# Patient Record
Sex: Female | Born: 1995 | Race: Black or African American | Hispanic: No | Marital: Single | Smoking: Never smoker
Health system: Southern US, Community
[De-identification: ages and names within clinical notes are randomized; demographics above are authoritative.]

## PROBLEM LIST (undated history)

## (undated) DIAGNOSIS — Z1159 Encounter for screening for other viral diseases: Secondary | ICD-10-CM

## (undated) DIAGNOSIS — F321 Major depressive disorder, single episode, moderate: Principal | ICD-10-CM

## (undated) HISTORY — PX: TONSILLECTOMY: SUR1361

---
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
## 2016-01-20 ENCOUNTER — Encounter (HOSPITAL_COMMUNITY): Payer: Self-pay | Admitting: Emergency Medicine

## 2016-01-20 ENCOUNTER — Emergency Department (HOSPITAL_COMMUNITY)
Admission: EM | Admit: 2016-01-20 | Discharge: 2016-01-20 | Disposition: A | Discharge status: Home / Self Care | Payer: BLUE CROSS/BLUE SHIELD | Attending: Emergency Medicine | Admitting: Emergency Medicine

## 2016-01-20 DIAGNOSIS — Z79899 Other long term (current) drug therapy: Secondary | ICD-10-CM | POA: Insufficient documentation

## 2016-01-20 DIAGNOSIS — Y999 Unspecified external cause status: Secondary | ICD-10-CM | POA: Diagnosis not present

## 2016-01-20 DIAGNOSIS — X58XXXA Exposure to other specified factors, initial encounter: Secondary | ICD-10-CM | POA: Insufficient documentation

## 2016-01-20 DIAGNOSIS — Y929 Unspecified place or not applicable: Secondary | ICD-10-CM | POA: Insufficient documentation

## 2016-01-20 DIAGNOSIS — S30814A Abrasion of vagina and vulva, initial encounter: Secondary | ICD-10-CM | POA: Insufficient documentation

## 2016-01-20 DIAGNOSIS — Y939 Activity, unspecified: Secondary | ICD-10-CM | POA: Insufficient documentation

## 2016-01-20 DIAGNOSIS — N898 Other specified noninflammatory disorders of vagina: Secondary | ICD-10-CM | POA: Diagnosis present

## 2016-01-20 LAB — WET PREP, GENITAL
Clue Cells Wet Prep HPF POC: NONE SEEN
SPERM: NONE SEEN
Trich, Wet Prep: NONE SEEN
Yeast Wet Prep HPF POC: NONE SEEN

## 2016-01-20 LAB — POC URINE PREG, ED: PREG TEST UR: NEGATIVE

## 2016-01-20 MED ORDER — IBUPROFEN 800 MG PO TABS
800.0000 mg | ORAL_TABLET | Freq: Once | ORAL | Status: AC
  Administered 2016-01-20: 800 mg via ORAL
  Filled 2016-01-20: qty 1

## 2016-01-20 NOTE — Discharge Instructions (Signed)
1. Medications: usual home medications 2. Treatment: rest, drink plenty of fluids, no sexual intercourse for 5-7 days 3. Follow Up: Please followup with your OB/GYN at your upcoming appointment for discussion of your diagnoses and further evaluation after today's visit; if you do not have a primary care doctor use the resource guide provided to find one; Please return to the ER for worsening symptoms

## 2016-01-20 NOTE — ED Provider Notes (Signed)
CSN: [id number]     Arrival date & time 01/20/16  0131 History   First MD Initiated Contact with Patient 01/20/16 [phone]     Chief Complaint  Patient presents with  . Vaginal Discharge  . Vaginal Pain     (Consider location/radiation/quality/duration/timing/severity/associated sxs/prior Treatment) Patient is a 20 y.o. female presenting with vaginal pain. The history is provided by the patient and medical records. No language interpreter was used.  Vaginal Pain Pertinent negatives include no abdominal pain, chest pain, coughing, diaphoresis, fatigue, fever, headaches, nausea, rash or vomiting.     Audrey Ryan is a 20 y.o. female  with No major medical problems presents to the Emergency Department complaining of gradual, persistent, progressively worsening vaginal pain onset this morning.  Reports the pain as a soreness and rated at a 6 out of 10. Patient reports she sexually active with 1 female partner. She reports this the same partner for the last 12 months. She reports experimenting with a new sexual position last night and this morning had vaginal pain. She reports that she attempted to have sexual intercourse this afternoon when the pain increased. She reports that she has some vaginal discharge but this is normal for her and it is clear nonodorous. She denies urinary symptoms. She denies fever, chills, abdominal pain, nausea, vomiting, diarrhea. Patient reports she has nexplanon, but intermittently has unprotected sex. No treatments prior to arrival. No alleviating factors.   History reviewed. No pertinent past medical history. History reviewed. No pertinent past surgical history. No family history on file. Social History  Substance Use Topics  . Smoking status: Never Smoker   . Smokeless tobacco: None  . Alcohol Use: 1.8 oz/week    3 Shots of liquor per week   OB History    No data available     Review of Systems  Constitutional: Negative for fever, diaphoresis, appetite  change, fatigue and unexpected weight change.  HENT: Negative for mouth sores.   Eyes: Negative for visual disturbance.  Respiratory: Negative for cough, chest tightness, shortness of breath and wheezing.   Cardiovascular: Negative for chest pain.  Gastrointestinal: Negative for nausea, vomiting, abdominal pain, diarrhea and constipation.  Endocrine: Negative for polydipsia, polyphagia and polyuria.  Genitourinary: Positive for vaginal discharge and vaginal pain. Negative for dysuria, urgency, frequency and hematuria.  Musculoskeletal: Negative for back pain and neck stiffness.  Skin: Negative for rash.  Allergic/Immunologic: Negative for immunocompromised state.  Neurological: Negative for syncope, light-headedness and headaches.  Hematological: Does not bruise/bleed easily.  Psychiatric/Behavioral: Negative for sleep disturbance. The patient is not nervous/anxious.       Allergies  Other  Home Medications   Prior to Admission medications   Medication Sig Start Date End Date Taking? Authorizing Provider  acetaminophen (TYLENOL) 500 MG tablet Take 1,000 mg by mouth every 6 (six) hours as needed (for pain.).   Yes Historical Provider, MD  etonogestrel (NEXPLANON) 68 MG IMPL implant 1 each by Subdermal route once.   Yes Historical Provider, MD  ibuprofen (ADVIL,MOTRIN) 200 MG tablet Take 400 mg by mouth every 6 (six) hours as needed (for pain.).   Yes Historical Provider, MD   BP 104/89 mmHg  Pulse 71  Temp(Src) 98.4 F (36.9 C) (Oral)  Resp 17  Ht  (1.549 m)  Wt 58.968 kg  BMI 24.58 kg/m2  SpO2 100% Physical Exam  Constitutional: She appears well-developed and well-nourished. No distress.  HENT:  Head: Normocephalic and atraumatic.  Eyes: Conjunctivae are normal.  Neck: Normal  range of motion.  Cardiovascular: Normal rate, regular rhythm, normal heart sounds and intact distal pulses.   No murmur heard. Pulmonary/Chest: Effort normal and breath sounds normal. No  respiratory distress. She has no wheezes.  Abdominal: Soft. Bowel sounds are normal. There is no tenderness. There is no rebound and no guarding. Hernia confirmed negative in the right inguinal area and confirmed negative in the left inguinal area.  Genitourinary: Uterus normal. No labial fusion. There is no rash, tenderness or lesion on the right labia. There is no rash, tenderness or lesion on the left labia. Uterus is not deviated, not enlarged, not fixed and not tender. Cervix exhibits no motion tenderness, no discharge and no friability. Right adnexum displays no mass, no tenderness and no fullness. Left adnexum displays no mass, no tenderness and no fullness. No erythema, tenderness or bleeding in the vagina. No foreign body around the vagina. No signs of injury around the vagina. Vaginal discharge (minimal, white, nonodorous ) found.  Several small abrasions to the walls of the vaginal vault; no lacerations or tears noted. No bleeding  Musculoskeletal: Normal range of motion. She exhibits no edema.  Lymphadenopathy:       Right: No inguinal adenopathy present.       Left: No inguinal adenopathy present.  Neurological: She is alert.  Skin: Skin is warm and dry. She is not diaphoretic. No erythema.  Psychiatric: She has a normal mood and affect.  Nursing note and vitals reviewed.   ED Course  Procedures (including critical care time) Labs Review Labs Reviewed  WET PREP, GENITAL - Abnormal; Notable for the following:    WBC, Wet Prep HPF POC MANY (*)    All other components within normal limits  POC URINE PREG, ED  GC/CHLAMYDIA PROBE AMP ([place]) NOT AT [facility]    MDM   Final diagnoses:  Vaginal abrasion, initial encounter   [name] presents with vaginal pain after intercourse.  Small abrasions noted; no lacerations. No signs of infection or CMT.  Neg pregnancy test. Recommend abstinence until pain is resolved.  Ibuprofen for pain.    Dahlia ClientHannah Manilla Strieter,  PA-C 01/20/16 [phone]  Lyndal Pulleyaniel Knott, MD 01/20/16 [phone]

## 2016-01-20 NOTE — ED Notes (Signed)
Pt states that after having intercourse last night she began feeling pain in her vagina. Pt describes this as a "soreness" that she rates at 6/10. Pt states she currently has vaginal discharge, but describes it as "normal and clear". Pt denies urinary symptoms. Pt states she has had unprotected sex

## 2016-01-20 NOTE — ED Notes (Signed)
Pt reports understanding of discharge information. No questions at time of discharge 

## 2016-01-20 NOTE — ED Notes (Signed)
Pt reports soreness in vagina that started yesterday morning after intercourse. PT denies any discharge or urinary symptoms.

## 2016-01-21 LAB — GC/CHLAMYDIA PROBE AMP (~~LOC~~) NOT AT ARMC
Chlamydia: NEGATIVE
NEISSERIA GONORRHEA: NEGATIVE

## 2016-11-25 ENCOUNTER — Emergency Department (HOSPITAL_COMMUNITY): Payer: BLUE CROSS/BLUE SHIELD

## 2016-11-25 ENCOUNTER — Emergency Department (HOSPITAL_COMMUNITY)
Admission: EM | Admit: 2016-11-25 | Discharge: 2016-11-25 | Disposition: A | Discharge status: Home / Self Care | Payer: BLUE CROSS/BLUE SHIELD | Attending: Emergency Medicine | Admitting: Emergency Medicine

## 2016-11-25 ENCOUNTER — Encounter (HOSPITAL_COMMUNITY): Payer: Self-pay

## 2016-11-25 DIAGNOSIS — M654 Radial styloid tenosynovitis [de Quervain]: Secondary | ICD-10-CM | POA: Insufficient documentation

## 2016-11-25 DIAGNOSIS — Z79899 Other long term (current) drug therapy: Secondary | ICD-10-CM | POA: Insufficient documentation

## 2016-11-25 DIAGNOSIS — M25531 Pain in right wrist: Secondary | ICD-10-CM | POA: Diagnosis present

## 2016-11-25 MED ORDER — MELOXICAM 15 MG PO TABS: 15.0000 mg | ORAL_TABLET | Freq: Every day | ORAL | 0 refills | Status: DC

## 2016-11-25 NOTE — Discharge Instructions (Signed)
General instructions  Return to your normal activities as told by your health care provider. Ask your health care provider what activities are safe for you. Take over-the-counter and prescription medicines only as told by your health care provider. Keep all follow-up visits as told by your health care provider. This is important. Do not drive or operate heavy machinery while taking prescription pain medicine. Contact a health care provider if: Your pain, tenderness, or swelling gets worse, even if you have had treatment. You have numbness or tingling in your wrist, hand, or fingers on the injured side.

## 2016-11-25 NOTE — ED Notes (Signed)
Pt well appearing, alert and oriented. Ambulates off unit accompanied by friends

## 2016-11-25 NOTE — ED Provider Notes (Signed)
[facility] Provider Note   CSN: [id number] Arrival date & time: 11/25/16  1649     History   Chief Complaint Chief Complaint  Patient presents with  . Wrist Pain    HPI Audrey Ryan is a 21 y.o. female. She is a Archivistcollege student. She presents emergency Department with chief complaint of 2 weeks of worsening pain in her right wrist and thumb. She has no inciting injuries. She does type a lot. She's never had a pain like this before. Pain is worse when she moves her some especially with extension. She denies any fevers or chills.   Wrist Pain     History reviewed. No pertinent past medical history.  There are no active problems to display for this patient.   Past Surgical History:  Procedure Laterality Date  . TONSILLECTOMY      OB History    No data available       Home Medications    Prior to Admission medications   Medication Sig Start Date End Date Taking? Authorizing Provider  acetaminophen (TYLENOL) 500 MG tablet Take 1,000 mg by mouth every 6 (six) hours as needed (for pain.).    [provider]  etonogestrel (NEXPLANON) 68 MG IMPL implant 1 each by Subdermal route once.    [provider]  ibuprofen (ADVIL,MOTRIN) 200 MG tablet Take 400 mg by mouth every 6 (six) hours as needed (for pain.).    [provider]  meloxicam (MOBIC) 15 MG tablet Take 1 tablet (15 mg total) by mouth daily. Take 1 daily with food. 11/25/16   Arthor CaptainHarris, Macky Galik, PA-C    Family History No family history on file.  Social History Social History  Substance Use Topics  . Smoking status: Never Smoker  . Smokeless tobacco: Never Used  . Alcohol use 1.8 oz/week    3 Shots of liquor per week     Allergies   Other   Review of Systems Review of Systems  Constitutional: Negative for chills and fever.  Musculoskeletal: Positive for myalgias.     Physical Exam Updated Vital Signs BP 122/77 (BP Location: Left Arm)   Pulse 92   Temp 98.6  F (37 C) (Oral)   Resp 17   SpO2 100%   Physical Exam  Constitutional: She is oriented to person, place, and time. She appears well-developed and well-nourished. No distress.  HENT:  Head: Normocephalic and atraumatic.  Eyes: Conjunctivae are normal. No scleral icterus.  Neck: Normal range of motion.  Cardiovascular: Normal rate, regular rhythm and normal heart sounds.  Exam reveals no gallop and no friction rub.   No murmur heard. Pulmonary/Chest: Effort normal and breath sounds normal. No respiratory distress.  Abdominal: Soft. Bowel sounds are normal. She exhibits no distension and no mass. There is no tenderness. There is no guarding.  Musculoskeletal:  Exquisitely tender to palpation along the extensor tendons of the right thumb. No heat, swelling or crepitus.  Neurological: She is alert and oriented to person, place, and time.  Skin: Skin is warm and dry. She is not diaphoretic.  Psychiatric: Her behavior is normal.  Nursing note and vitals reviewed.    ED Treatments / Results  Labs (all labs ordered are listed, but only abnormal results are displayed) Labs Reviewed - No data to display  EKG  EKG Interpretation None       Radiology Dg Wrist Complete Right  Result Date: 11/25/2016 CLINICAL DATA:  Pain to the lateral wrist area EXAM: RIGHT WRIST -  COMPLETE 3+ VIEW COMPARISON:  None. FINDINGS: There is no evidence of fracture or dislocation. There is no evidence of arthropathy or other focal bone abnormality. Soft tissues are unremarkable. IMPRESSION: Negative. Electronically Signed   By: Jasmine Pang M.D.   On: 11/25/2016 17:19    Procedures Procedures (including critical care time)  Medications Ordered in ED Medications - No data to display   Initial Impression / Assessment and Plan / ED Course  I have reviewed the triage vital signs and the nursing notes.  Pertinent labs & imaging results that were available during my care of the patient were reviewed by me  and considered in my medical decision making (see chart for details).  Clinical Course as of Nov 25 1849  Tue Nov 25, 2016  1809 DG Wrist Complete Right [AH]    Clinical Course User Index [AH] Arthor Captain, PA-C     Patient's x-ray negative for any obvious fracture, dislocation or other acute ligamentous injury. Her physical examination is consistent with de Quervain's tenosynovitis. I doubt any infectious etiology. Patient will be treated with conservative therapy with a removable thumb spica, ice, anti-inflammatories and rest. She's been given follow-up with hand specialist.  Final Clinical Impressions(s) / ED Diagnoses   Final diagnoses:  Tenosynovitis, de Quervain    New Prescriptions Discharge Medication List as of 11/25/2016  6:42 PM    START taking these medications   Details  meloxicam (MOBIC) 15 MG tablet Take 1 tablet (15 mg total) by mouth daily. Take 1 daily with food., Starting Tue 11/25/2016, Print         Tiburcio Pea [place], PA-C 11/25/16 1851    Blane Ohara, MD 11/26/16 [id number]

## 2016-11-25 NOTE — ED Triage Notes (Signed)
Pt reports right wrist pain for 2 weeks, she is not sure how she injured it. Pt reports she looked up her symptoms on google and thinks it is a bone spur

## 2016-11-25 NOTE — Progress Notes (Signed)
Orthopedic Tech Progress Note Patient Details:  Leanna SatoMichaela Ey Mar 29, 1996 [id number]  Ortho Devices Type of Ortho Device: Thumb velcro splint Ortho Device/Splint Location: applied thumb velcro splint to pt right hand. pt tolerated well.  Family at bedside.  Right Hand.  Ortho Device/Splint Interventions: Application, Adjustment   Alvina ChouWilliams, Jeremiah Tarpley C 11/25/2016, 7:00 PM

## 2017-05-12 ENCOUNTER — Ambulatory Visit (HOSPITAL_COMMUNITY)
Admission: EM | Admit: 2017-05-12 | Discharge: 2017-05-12 | Disposition: A | Discharge status: Home / Self Care | Payer: BLUE CROSS/BLUE SHIELD | Attending: Internal Medicine | Admitting: Internal Medicine

## 2017-05-12 ENCOUNTER — Encounter (HOSPITAL_COMMUNITY): Payer: Self-pay | Admitting: Emergency Medicine

## 2017-05-12 DIAGNOSIS — B349 Viral infection, unspecified: Secondary | ICD-10-CM | POA: Diagnosis not present

## 2017-05-12 MED ORDER — CETIRIZINE-PSEUDOEPHEDRINE ER 5-120 MG PO TB12: 1.0000 | ORAL_TABLET | Freq: Every day | ORAL | 0 refills | Status: DC

## 2017-05-12 MED ORDER — BENZONATATE 100 MG PO CAPS: 100.0000 mg | ORAL_CAPSULE | Freq: Three times a day (TID) | ORAL | 0 refills | Status: DC

## 2017-05-12 MED ORDER — FLUTICASONE PROPIONATE 50 MCG/ACT NA SUSP: 2.0000 | Freq: Every day | NASAL | 0 refills | Status: DC

## 2017-05-12 NOTE — ED Provider Notes (Signed)
[facility]    CSN: [id number] Arrival date & time: 05/12/17  1231     History   Chief Complaint Chief Complaint  Patient presents with  . Influenza    HPI Leanna SatoMichaela Stonebraker is a 21 y.o. female.   21 year old female comes in for 3-4 day history of URI symptoms. She has had sore throat, congestion, rhinorrhea, productive cough, body aches. Subjective fever. Also has some bilateral otalgia. Has been taking dayquil/nyquil without improvement. Positive sick contact, stating her roommate was tested positive for flu. Never smoker.        History reviewed. No pertinent past medical history.  There are no active problems to display for this patient.   Past Surgical History:  Procedure Laterality Date  . TONSILLECTOMY      OB History    No data available       Home Medications    Prior to Admission medications   Medication Sig Start Date End Date Taking? Authorizing Provider  etonogestrel (NEXPLANON) 68 MG IMPL implant 1 each by Subdermal route once.   Yes [provider]  benzonatate (TESSALON) 100 MG capsule Take 1 capsule (100 mg total) every 8 (eight) hours by mouth. 05/12/17   Cathie HoopsYu, Amy V, PA-C  cetirizine-pseudoephedrine (ZYRTEC-D) 5-120 MG tablet Take 1 tablet daily by mouth. 05/12/17   Cathie HoopsYu, Amy V, PA-C  fluticasone (FLONASE) 50 MCG/ACT nasal spray Place 2 sprays daily into both nostrils. 05/12/17   Belinda FisherYu, Amy V, PA-C    Family History No family history on file.  Social History Social History   Tobacco Use  . Smoking status: Never Smoker  . Smokeless tobacco: Never Used  Substance Use Topics  . Alcohol use: Yes    Alcohol/week: 1.8 oz    Types: 3 Shots of liquor per week  . Drug use: Not on file     Allergies   Other   Review of Systems Review of Systems  Reason unable to perform ROS: See HPI as above.     Physical Exam Triage Vital Signs ED Triage Vitals  Enc Vitals Group     BP 05/12/17 1319 (!) 141/90     Pulse Rate  05/12/17 1319 (!) 59     Resp 05/12/17 1319 16     Temp 05/12/17 1319 99.2 F (37.3 C)     Temp Source 05/12/17 1319 Temporal     SpO2 05/12/17 1319 100 %     Weight 05/12/17 1319 135 lb (61.2 kg)     Height 05/12/17 1319 5\' 1"  (1.549 m)     Head Circumference --      Peak Flow --      Pain Score 05/12/17 1320 7     Pain Loc --      Pain Edu? --      Excl. in GC? --    No data found.  Updated Vital Signs BP (!) 141/90   Pulse (!) 59   Temp 99.2 F (37.3 C) (Temporal)   Resp 16   Ht 5\' 1"  (1.549 m)   Wt 135 lb (61.2 kg)   SpO2 100%   BMI 25.51 kg/m   Physical Exam  Constitutional: She is oriented to person, place, and time. She appears well-developed and well-nourished. No distress.  HENT:  Head: Normocephalic and atraumatic.  Right Ear: External ear and ear canal normal. Tympanic membrane is erythematous. Tympanic membrane is not bulging.  Left Ear: External ear and ear canal normal. Tympanic membrane is  erythematous. Tympanic membrane is not bulging.  Nose: Mucosal edema and rhinorrhea present. Right sinus exhibits maxillary sinus tenderness and frontal sinus tenderness. Left sinus exhibits maxillary sinus tenderness and frontal sinus tenderness.  Mouth/Throat: Uvula is midline, oropharynx is clear and moist and mucous membranes are normal.  Eyes: Conjunctivae are normal. Pupils are equal, round, and reactive to light.  Neck: Normal range of motion. Neck supple.  Cardiovascular: Normal rate, regular rhythm and normal heart sounds. Exam reveals no gallop and no friction rub.  No murmur heard. Pulmonary/Chest: Effort normal and breath sounds normal. She has no decreased breath sounds. She has no wheezes. She has no rhonchi. She has no rales.  Lymphadenopathy:    She has no cervical adenopathy.  Neurological: She is alert and oriented to person, place, and time.  Skin: Skin is warm and dry.  Psychiatric: She has a normal mood and affect. Her behavior is normal. Judgment  normal.     UC Treatments / Results  Labs (all labs ordered are listed, but only abnormal results are displayed) Labs Reviewed - No data to display  EKG  EKG Interpretation None       Radiology No results found.  Procedures Procedures (including critical care time)  Medications Ordered in UC Medications - No data to display   Initial Impression / Assessment and Plan / UC Course  I have reviewed the triage vital signs and the nursing notes.  Pertinent labs & imaging results that were available during my care of the patient were reviewed by me and considered in my medical decision making (see chart for details).    Discussed with patient history and exam most consistent with viral URI. Symptomatic treatment as needed. Push fluids. Return precautions given.   Final Clinical Impressions(s) / UC Diagnoses   Final diagnoses:  Viral illness    ED Discharge Orders        Ordered    benzonatate (TESSALON) 100 MG capsule  Every 8 hours     05/12/17 1440    fluticasone (FLONASE) 50 MCG/ACT nasal spray  Daily     05/12/17 1440    cetirizine-pseudoephedrine (ZYRTEC-D) 5-120 MG tablet  Daily     05/12/17 1440        Belinda FisherYu, Amy V, [place] 05/12/17 1443

## 2017-05-12 NOTE — ED Triage Notes (Signed)
PT reports flu symptoms for 3-4 days. PT reports her roommate tested positive for the flu

## 2017-05-12 NOTE — Discharge Instructions (Signed)
Tessalon for cough. Start flonase, zyrtec-D for nasal congestion. You can use over the counter nasal saline rinse such as neti pot for nasal congestion. Keep hydrated, your urine should be clear to pale yellow in color. Tylenol/motrin for fever and pain. Monitor for any worsening of symptoms, chest pain, shortness of breath, wheezing, swelling of the throat, follow up for reevaluation.  ° °For sore throat try using a honey-based tea. Use 3 teaspoons of honey with juice squeezed from half lemon. Place shaved pieces of ginger into 1/2-1 cup of water and warm over stove top. Then mix the ingredients and repeat every 4 hours as needed. ° °

## 2018-02-07 IMAGING — DX DG WRIST COMPLETE 3+V*R*
4 series · 4 of 4 positions shown · non-contrast
Comparison: None.

CLINICAL DATA: Pain to the lateral wrist area

EXAM:
RIGHT WRIST - COMPLETE 3+ VIEW

[wrist pa]
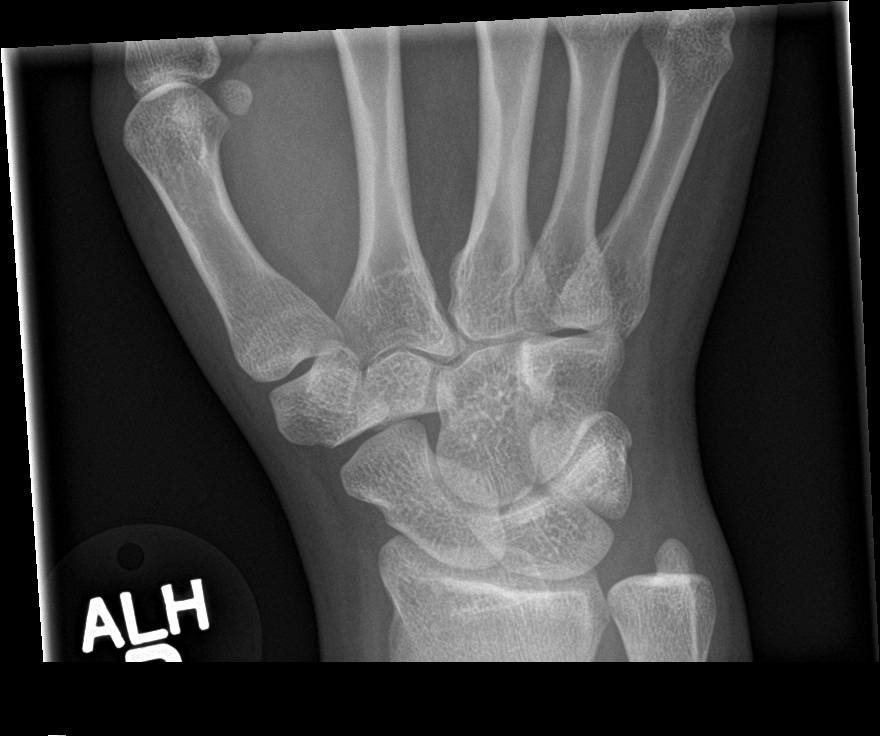

[wrist obl]
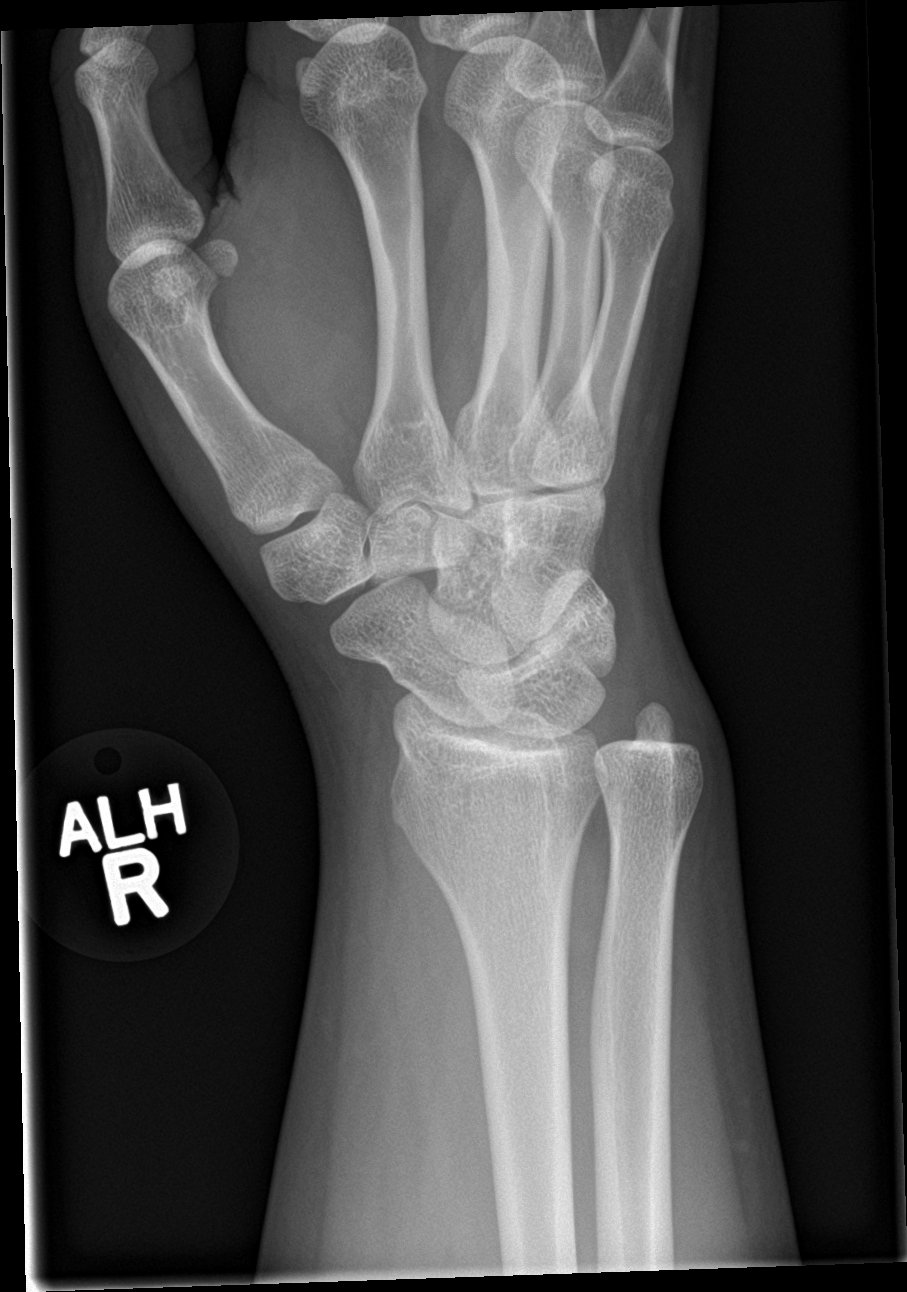

[wrist lat]
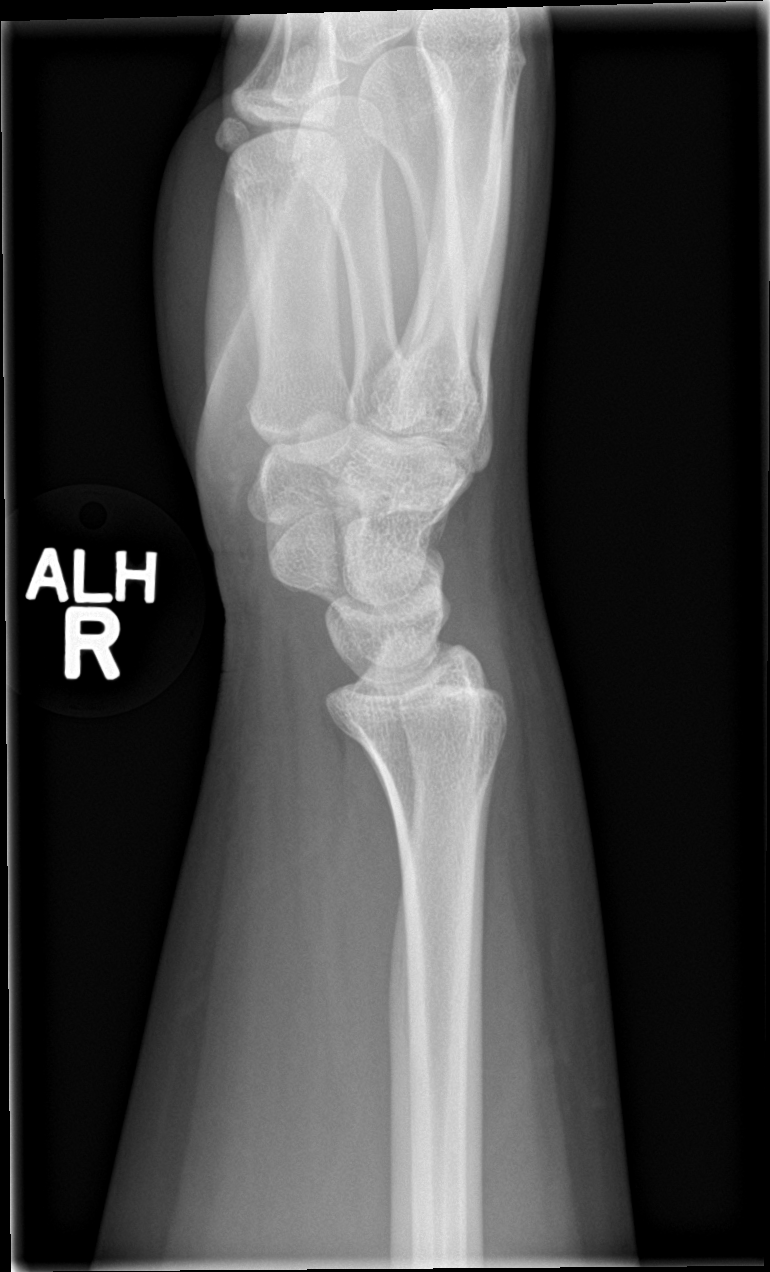

[wrist navicular]
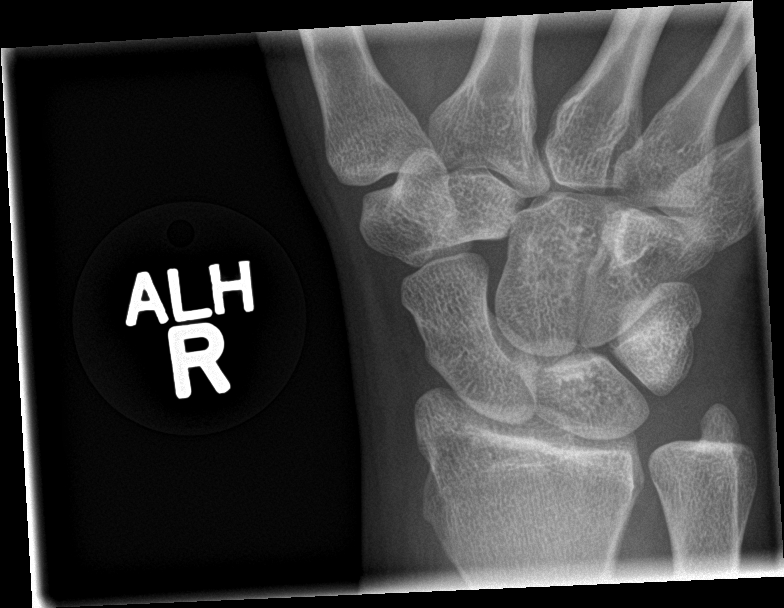

[4 of 4 positions shown; findings below may reference images not displayed]

FINDINGS: There is no evidence of fracture or dislocation. There is no
evidence of arthropathy or other focal bone abnormality. Soft
tissues are unremarkable.
IMPRESSION: Negative.

## 2018-03-31 ENCOUNTER — Other Ambulatory Visit: Payer: Self-pay

## 2018-03-31 ENCOUNTER — Encounter (HOSPITAL_COMMUNITY): Payer: Self-pay | Admitting: Emergency Medicine

## 2018-03-31 ENCOUNTER — Ambulatory Visit (HOSPITAL_COMMUNITY)
Admission: EM | Admit: 2018-03-31 | Discharge: 2018-03-31 | Disposition: A | Discharge status: Home / Self Care | Payer: BLUE CROSS/BLUE SHIELD | Attending: Family Medicine | Admitting: Family Medicine

## 2018-03-31 DIAGNOSIS — N760 Acute vaginitis: Secondary | ICD-10-CM

## 2018-03-31 DIAGNOSIS — N898 Other specified noninflammatory disorders of vagina: Secondary | ICD-10-CM

## 2018-03-31 LAB — POCT URINALYSIS DIP (DEVICE)
Bilirubin Urine: NEGATIVE
Glucose, UA: NEGATIVE mg/dL
KETONES UR: NEGATIVE mg/dL
Nitrite: NEGATIVE
PH: 5.5 (ref 5.0–8.0)
PROTEIN: NEGATIVE mg/dL
Urobilinogen, UA: 0.2 mg/dL (ref 0.0–1.0)

## 2018-03-31 MED ORDER — FLUCONAZOLE 150 MG PO TABS: 150.0000 mg | ORAL_TABLET | Freq: Once | ORAL | 0 refills | Status: AC

## 2018-03-31 MED ORDER — METRONIDAZOLE 500 MG PO TABS: 500.0000 mg | ORAL_TABLET | Freq: Two times a day (BID) | ORAL | 0 refills | Status: AC

## 2018-03-31 NOTE — Discharge Instructions (Signed)
Please take 1 diflucan today for yeast, begin metronidazole twice daily for 1 week for BV. Do not drink alcohol until 24 hours after take last tablet. May repeat diflucan after finishing BV medicine.   Please return if symptoms not improving with treatment, development of fever, nausea, vomiting, abdominal pain.

## 2018-03-31 NOTE — ED Triage Notes (Signed)
Pt reports vaginal itching and d/c x2 days.  She denies any other symptoms.  This is a recurrent issue and she has been evaluated by an obgyn in the past.

## 2018-03-31 NOTE — ED Provider Notes (Signed)
[facility]    CSN: [id number] Arrival date & time: 03/31/18  9562     History   Chief Complaint Chief Complaint  Patient presents with  . Vaginal Itching  . Vaginal Discharge    HPI Audrey Ryan is a 22 y.o. female notes no past medical history presenting today for evaluation of vaginal itching and discharge.  Patient states that she has a history of recurrent yeast infections.  Has her symptoms monthly.  Her symptoms began approximately 2 days ago.  She has been evaluated by OB/GYN for this previously.  She is also concerned that she may have BV as well as she has also had an odor.  She denies concern for STDs.  Patient uses Nexplanon, menstrual cycles are irregular.  Denies fever, nausea, vomiting, abdominal pain.   HPI  History reviewed. No pertinent past medical history.  There are no active problems to display for this patient.   Past Surgical History:  Procedure Laterality Date  . TONSILLECTOMY      OB History   None      Home Medications    Prior to Admission medications   Medication Sig Start Date End Date Taking? Authorizing Provider  etonogestrel (NEXPLANON) 68 MG IMPL implant 1 each by Subdermal route once.   Yes [provider]  benzonatate (TESSALON) 100 MG capsule Take 1 capsule (100 mg total) every 8 (eight) hours by mouth. 05/12/17   Cathie Hoops, Amy V, PA-C  cetirizine-pseudoephedrine (ZYRTEC-D) 5-120 MG tablet Take 1 tablet daily by mouth. 05/12/17   Cathie Hoops, Amy V, PA-C  fluconazole (DIFLUCAN) 150 MG tablet Take 1 tablet (150 mg total) by mouth once for 1 dose. 03/31/18 03/31/18  Wieters, Hallie C, PA-C  fluticasone (FLONASE) 50 MCG/ACT nasal spray Place 2 sprays daily into both nostrils. 05/12/17   Cathie Hoops, Amy V, PA-C  metroNIDAZOLE (FLAGYL) 500 MG tablet Take 1 tablet (500 mg total) by mouth 2 (two) times daily for 7 days. 03/31/18 04/07/18  Wieters, Junius Creamer, PA-C    Family History History reviewed. No pertinent family history.  Social  History Social History   Tobacco Use  . Smoking status: Never Smoker  . Smokeless tobacco: Never Used  Substance Use Topics  . Alcohol use: Yes    Alcohol/week: 3.0 standard drinks    Types: 3 Shots of liquor per week  . Drug use: Not on file     Allergies   Other   Review of Systems Review of Systems  Constitutional: Negative for fever.  Respiratory: Negative for shortness of breath.   Cardiovascular: Negative for chest pain.  Gastrointestinal: Negative for abdominal pain, diarrhea, nausea and vomiting.  Genitourinary: Positive for vaginal discharge. Negative for dysuria, flank pain, genital sores, hematuria, menstrual problem, vaginal bleeding and vaginal pain.  Musculoskeletal: Negative for back pain.  Skin: Negative for rash.  Neurological: Negative for dizziness, light-headedness and headaches.     Physical Exam Triage Vital Signs ED Triage Vitals  Enc Vitals Group     BP 03/31/18 0957 121/69     Pulse Rate 03/31/18 0957 (!) 59     Resp --      Temp 03/31/18 0957 98.4 F (36.9 C)     Temp Source 03/31/18 0957 Oral     SpO2 03/31/18 0957 100 %     Weight --      Height --      Head Circumference --      Peak Flow --      Pain  Score 03/31/18 0956 0     Pain Loc --      Pain Edu? --      Excl. in GC? --    No data found.  Updated Vital Signs BP 121/69 (BP Location: Right Arm)   Pulse (!) 59   Temp 98.4 F (36.9 C) (Oral)   SpO2 100%   Visual Acuity Right Eye Distance:   Left Eye Distance:   Bilateral Distance:    Right Eye Near:   Left Eye Near:    Bilateral Near:     Physical Exam  Constitutional: She is oriented to person, place, and time. She appears well-developed and well-nourished.  No acute distress  HENT:  Head: Normocephalic and atraumatic.  Nose: Nose normal.  Eyes: Conjunctivae are normal.  Neck: Neck supple.  Cardiovascular: Normal rate.  Pulmonary/Chest: Effort normal. No respiratory distress.  Abdominal: She exhibits no  distension.  Nontender to light deep palpation throughout all 4 quadrants and suprapubic area, negative CVA tenderness  Musculoskeletal: Normal range of motion.  Neurological: She is alert and oriented to person, place, and time.  Skin: Skin is warm and dry.  Psychiatric: She has a normal mood and affect.  Nursing note and vitals reviewed.    UC Treatments / Results  Labs (all labs ordered are listed, but only abnormal results are displayed) Labs Reviewed  POCT URINALYSIS DIP (DEVICE) - Abnormal; Notable for the following components:      Result Value   Hgb urine dipstick TRACE (*)    Leukocytes, UA TRACE (*)    All other components within normal limits    EKG None  Radiology No results found.  Procedures Procedures (including critical care time)  Medications Ordered in UC Medications - No data to display  Initial Impression / Assessment and Plan / UC Course  I have reviewed the triage vital signs and the nursing notes.  Pertinent labs & imaging results that were available during my care of the patient were reviewed by me and considered in my medical decision making (see chart for details).     Trace leuks, will go ahead and empirically treat for yeast and BV with Diflucan and metronidazole.  Advised patient to follow-up if not having improvement in symptoms.Discussed strict return precautions. Patient verbalized understanding and is agreeable with plan.  Final Clinical Impressions(s) / UC Diagnoses   Final diagnoses:  Vaginal discharge  Acute vaginitis     Discharge Instructions     Please take 1 diflucan today for yeast, begin metronidazole twice daily for 1 week for BV. Do not drink alcohol until 24 hours after take last tablet. May repeat diflucan after finishing BV medicine.   Please return if symptoms not improving with treatment, development of fever, nausea, vomiting, abdominal pain.    ED Prescriptions    Medication Sig Dispense Auth. Provider    fluconazole (DIFLUCAN) 150 MG tablet Take 1 tablet (150 mg total) by mouth once for 1 dose. 2 tablet Wieters, Hallie C, PA-C   metroNIDAZOLE (FLAGYL) 500 MG tablet Take 1 tablet (500 mg total) by mouth 2 (two) times daily for 7 days. 14 tablet Wieters, Rennert C, PA-C     Controlled Substance Prescriptions [place] Controlled Substance Registry consulted? Not Applicable   Lew Dawes, [place] 03/31/18 1113

## 2018-11-08 ENCOUNTER — Ambulatory Visit (HOSPITAL_COMMUNITY)
Admission: EM | Admit: 2018-11-08 | Discharge: 2018-11-08 | Disposition: A | Discharge status: Home / Self Care | Payer: BLUE CROSS/BLUE SHIELD | Attending: Family Medicine | Admitting: Family Medicine

## 2018-11-08 ENCOUNTER — Encounter (HOSPITAL_COMMUNITY): Payer: Self-pay

## 2018-11-08 ENCOUNTER — Other Ambulatory Visit: Payer: Self-pay

## 2018-11-08 DIAGNOSIS — B373 Candidiasis of vulva and vagina: Secondary | ICD-10-CM | POA: Diagnosis not present

## 2018-11-08 DIAGNOSIS — B3731 Acute candidiasis of vulva and vagina: Secondary | ICD-10-CM

## 2018-11-08 LAB — POCT URINALYSIS DIP (DEVICE)
Bilirubin Urine: NEGATIVE
Glucose, UA: NEGATIVE mg/dL
Hgb urine dipstick: NEGATIVE
Ketones, ur: NEGATIVE mg/dL
Leukocytes,Ua: NEGATIVE
Nitrite: NEGATIVE
Protein, ur: NEGATIVE mg/dL
Specific Gravity, Urine: >=1.03 (ref 1.005–1.030)
Urobilinogen, UA: 0.2 mg/dL (ref 0.0–1.0)
pH: 6 (ref 5.0–8.0)

## 2018-11-08 MED ORDER — FLUCONAZOLE 150 MG PO TABS: 150.0000 mg | ORAL_TABLET | Freq: Every day | ORAL | 0 refills | Status: AC

## 2018-11-08 NOTE — ED Triage Notes (Signed)
Formatting of this note might be different from the original.  Pt cc she has a vaginal discharge x 3 days. Pt state she has a white discharge and it itches and burns when she voids.   Electronically signed by Vernia Buff A at 11/08/2018  1:59 PM EDT

## 2018-11-08 NOTE — ED Provider Notes (Signed)
[facility]    CSN: [id number] Arrival date & [id number]: 11/08/18  1334     History   Chief Complaint Chief Complaint  Patient presents with  . Vaginal Discharge    HPI Audrey Ryan is a 23 y.o. female.   23 year female comes in for 3 day history of vaginal discharge. Describes it as cottage cheese like discharge. Denies odor. Has vaginal itching, dysuria. Denies frequency, hematuria. Denies abdominal pain, nausea, vomiting. Denies fever, chills, night sweats. Denies spotting. She is sexually active with one female partner, occasional condom use. LMP 10/09/2018. Has nexplanon, inserted 2019. She states used a new laundry detergent, and symptoms started a day afterwards. She is not worried for STDs.      History reviewed. No pertinent past medical history.  There are no active problems to display for this patient.   Past Surgical History:  Procedure Laterality Date  . TONSILLECTOMY      OB History   No obstetric history on file.      Home Medications    Prior to Admission medications   Medication Sig Start Date End Date Taking? Authorizing Provider  etonogestrel (NEXPLANON) 68 MG IMPL implant 1 each by Subdermal route once.    [provider]  fluconazole (DIFLUCAN) 150 MG tablet Take 1 tablet (150 mg total) by mouth daily. Take second dose 72 hours later if symptoms still persists. 11/08/18   Belinda FisherYu,  V, PA-C    Family History History reviewed. No pertinent family history.  Social History Social History   Tobacco Use  . Smoking status: Never Smoker  . Smokeless tobacco: Never Used  Substance Use Topics  . Alcohol use: Yes    Alcohol/week: 3.0 standard drinks    Types: 3 Shots of liquor per week  . Drug use: Not on file     Allergies   Other   Review of Systems Review of Systems  Reason unable to perform ROS: See HPI as above.     Physical Exam Triage Vital Signs ED Triage Vitals  Enc Vitals Group     BP 11/08/18 1357 98/68    Pulse Rate 11/08/18 1357 87     Resp 11/08/18 1357 16     Temp 11/08/18 1357 98.4 F (36.9 C)     Temp Source 11/08/18 1357 Oral     SpO2 11/08/18 1357 100 %     Weight 11/08/18 1354 125 lb (56.7 kg)     Height --      Head Circumference --      Peak Flow --      Pain Score 11/08/18 1354 1     Pain Loc --      Pain Edu? --      Excl. in GC? --    No data found.  Updated Vital Signs BP 98/68 (BP Location: Right Arm)   Pulse 87   Temp 98.4 F (36.9 C) (Oral)   Resp 16   Wt 125 lb (56.7 kg)   LMP 10/09/2018   SpO2 100%   BMI 23.62 kg/m   Physical Exam Constitutional:      General: She is not in acute distress.    Appearance: She is well-developed. She is not ill-appearing, toxic-appearing or diaphoretic.  HENT:     Head: Normocephalic and atraumatic.  Eyes:     Conjunctiva/sclera: Conjunctivae normal.     Pupils: Pupils are equal, round, and reactive to light.  Cardiovascular:     Rate and Rhythm: Normal  rate and regular rhythm.     Heart sounds: Normal heart sounds. No murmur. No friction rub. No gallop.   Pulmonary:     Effort: Pulmonary effort is normal.     Breath sounds: Normal breath sounds. No wheezing or rales.  Abdominal:     General: Bowel sounds are normal.     Palpations: Abdomen is soft.     Tenderness: There is no abdominal tenderness. There is no right CVA tenderness, left CVA tenderness, guarding or rebound.  Skin:    General: Skin is warm and dry.  Neurological:     Mental Status: She is alert and oriented to person, place, and time.  Psychiatric:        Behavior: Behavior normal.        Judgment: Judgment normal.      UC Treatments / Results  Labs (all labs ordered are listed, but only abnormal results are displayed) Labs Reviewed  POCT URINALYSIS DIP (DEVICE)    EKG None  Radiology No results found.  Procedures Procedures (including critical care time)  Medications Ordered in UC Medications - No data to display  Initial  Impression / Assessment and Plan / UC Course  I have reviewed the triage vital signs and the nursing notes.  Pertinent labs & imaging results that were available during my care of the patient were reviewed by me and considered in my medical decision making (see chart for details).    Will treat for yeast. Diflucan as directed. Patient deferred cytology testing. Refrain from new laundry detergent. Return precautions given. Patient expresses understanding and agrees to plan.  Final Clinical Impressions(s) / UC Diagnoses   Final diagnoses:  Yeast vaginitis   ED Prescriptions    Medication Sig Dispense Auth. Provider   fluconazole (DIFLUCAN) 150 MG tablet Take 1 tablet (150 mg total) by mouth daily. Take second dose 72 hours later if symptoms still persists. 2 tablet Threasa Alpha, PA-C 11/08/18 1555

## 2018-11-08 NOTE — Discharge Instructions (Signed)
You were treated empirically for yeast. Start diflucan as directed. Refrain from sexual activity and alcohol use for the next 7 days. Monitor for any worsening of symptoms, fever, abdominal pain, nausea, vomiting, to follow up for reevaluation.

## 2018-11-08 NOTE — ED Triage Notes (Signed)
Pt cc she has a vaginal discharge x 3 days. Pt state she has a white discharge and it itches and burns when she voids.

## 2018-11-08 NOTE — ED Provider Notes (Signed)
Formatting of this note is different from the original.  Images from the original note were not included.    [place]    CSN: [id number]  Arrival date & time: 11/08/18  1334    History    Chief Complaint  Chief Complaint   Patient presents with   ? Vaginal Discharge     HPI  Brandy Acevedo is a 23 y.o. female.     23 year female comes in for 3 day history of vaginal discharge. Describes it as cottage cheese like discharge. Denies odor. Has vaginal itching, dysuria. Denies frequency, hematuria. Denies abdominal pain, nausea, vomiting. Denies fever, chills, night sweats. Denies spotting. She is sexually active with one female partner, occasional condom use. LMP 10/09/2018. Has nexplanon, inserted 2019. She states used a new laundry detergent, and symptoms started a day afterwards. She is not worried for STDs.     History reviewed. No pertinent past medical history.    There are no active problems to display for this patient.    Past Surgical History:   Procedure Laterality Date   ? TONSILLECTOMY       OB History    No obstetric history on file.      Home Medications      Prior to Admission medications    Medication Sig Start Date End Date Taking? Authorizing Provider   etonogestrel (NEXPLANON) 68 MG IMPL implant 1 each by Subdermal route once.    [provider]   fluconazole (DIFLUCAN) 150 MG tablet Take 1 tablet (150 mg total) by mouth daily. Take second dose 72 hours later if symptoms still persists. 11/08/18   Ok Edwards, PA-C     Family History  History reviewed. No pertinent family history.    Social History  Social History     Tobacco Use   ? Smoking status: Never Smoker   ? Smokeless tobacco: Never Used   Substance Use Topics   ? Alcohol use: Yes     Alcohol/week: 3.0 standard drinks     Types: 3 Shots of liquor per week   ? Drug use: Not on file     Allergies    Other    Review of Systems  Review of Systems   Reason unable to perform ROS: See HPI as above.     Physical Exam  Triage Vital  Signs  ED Triage Vitals   Enc Vitals Group      BP 11/08/18 1357 98/68      Pulse Rate 11/08/18 1357 87      Resp 11/08/18 1357 16      Temp 11/08/18 1357 98.4 F (36.9 C)      Temp Source 11/08/18 1357 Oral      SpO2 11/08/18 1357 100 %      Weight 11/08/18 1354 125 lb (56.7 kg)      Height --       Head Circumference --       Peak Flow --       Pain Score 11/08/18 1354 1      Pain Loc --       Pain Edu? --       Excl. in [place]? --      No data found.    Updated Vital Signs  BP 98/68 (BP Location: Right Arm)   Pulse 87   Temp 98.4 F (36.9 C) (Oral)   Resp 16   Wt 125 lb (56.7 kg)   LMP 10/09/2018  SpO2 100%   BMI 23.62 kg/m     Physical Exam  Constitutional:       General: She is not in acute distress.     Appearance: She is well-developed. She is not ill-appearing, toxic-appearing or diaphoretic.   HENT:      Head: Normocephalic and atraumatic.   Eyes:      Conjunctiva/sclera: Conjunctivae normal.      Pupils: Pupils are equal, round, and reactive to light.   Cardiovascular:      Rate and Rhythm: Normal rate and regular rhythm.      Heart sounds: Normal heart sounds. No murmur. No friction rub. No gallop.    Pulmonary:      Effort: Pulmonary effort is normal.      Breath sounds: Normal breath sounds. No wheezing or rales.   Abdominal:      General: Bowel sounds are normal.      Palpations: Abdomen is soft.      Tenderness: There is no abdominal tenderness. There is no right CVA tenderness, left CVA tenderness, guarding or rebound.   Skin:     General: Skin is warm and dry.   Neurological:      Mental Status: She is alert and oriented to person, place, and time.   Psychiatric:         Behavior: Behavior normal.         Judgment: Judgment normal.     UC Treatments / Results   Labs  (all labs ordered are listed, but only abnormal results are displayed)  Labs Reviewed   POCT URINALYSIS DIP (DEVICE)     EKG  None    Radiology  No results found.    Procedures  Procedures (including critical care  time)    Medications Ordered in UC  Medications - No data to display    Initial Impression / Assessment and Plan / UC Course   I have reviewed the triage vital signs and the nursing notes.    Pertinent labs & imaging results that were available during my care of the patient were reviewed by me and considered in my medical decision making (see chart for details).      Will treat for yeast. Diflucan as directed. Patient deferred cytology testing. Refrain from new laundry detergent. Return precautions given. Patient expresses understanding and agrees to plan.    Final Clinical Impressions(s) / UC Diagnoses     Final diagnoses:   Yeast vaginitis     ED Prescriptions     Medication Sig Dispense Auth. Provider    fluconazole (DIFLUCAN) 150 MG tablet Take 1 tablet (150 mg total) by mouth daily. Take second dose 72 hours later if symptoms still persists. 2 tablet Tobin Chad, PA-C  11/08/18 1555    Electronically signed by Ok Edwards, PA-C at 11/08/2018  3:55 PM EDT

## 2022-08-21 ENCOUNTER — Encounter: Admit: 2022-08-21 | Discharge: 2022-08-21 | Payer: BLUE CROSS/BLUE SHIELD | Attending: Family | Primary: Family

## 2022-08-21 ENCOUNTER — Encounter

## 2022-08-21 DIAGNOSIS — F321 Major depressive disorder, single episode, moderate: Secondary | ICD-10-CM

## 2022-08-21 DIAGNOSIS — Z7689 Persons encountering health services in other specified circumstances: Secondary | ICD-10-CM

## 2022-08-21 NOTE — Assessment & Plan Note (Signed)
New-onset, declines pharmacologic treatment, unable to afford talk therapy, given info for [facility] for resources  Check labs

## 2022-08-21 NOTE — Assessment & Plan Note (Signed)
New-onset, declines pharmacologic treatment and talk therapy  Given info for [facility] for resources  Requesting ESA letter since she will be moving into a new apartment, will consider at next visit

## 2022-08-21 NOTE — Progress Notes (Signed)
Brandy Acevedo is a 27 y.o. female is seen on 08/21/2022 for Establish Care ( Last PCP was in [place] . ), Depression, Other ( Pt maternal grandmother had breast cancer . Pts mother got BRCA gene testing would like to know if she needs this or mammogram . ), and Anxiety    Assessment & Plan:     1. Moderate major depression (HCC)  Assessment & Plan:  New-onset, declines pharmacologic treatment, unable to afford talk therapy, given info for [facility] for resources  Check labs  Orders:  -     CBC with Auto Differential; Future  -     Comprehensive Metabolic Panel; Future  -     TSH; Future  2. Anxiety  Assessment & Plan:  New-onset, declines pharmacologic treatment and talk therapy  Given info for [facility] for resources  Requesting ESA letter since she will be moving into a new apartment, will consider at next visit  Orders:  -     CBC with Auto Differential; Future  -     Comprehensive Metabolic Panel; Future  -     TSH; Future  3. Need for hepatitis C screening test  -     Hepatitis C Antibody; Future  4. Screening for lipid disorders  -     Lipid Panel; Future  5. Needs flu shot  -     Influenza, FLUCELVAX, (age 54 mo+), IM, Preservative Free, 0.5 mL  6. Encounter to establish care    Follow-up and Dispositions    Return in about 4 weeks (around 09/18/2022) for PHYSICAL, depression, anxiety, lab results.       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  ETOH use- once a week  Recreational drug use- none  Tobacco use- none    Gyn Hx:  Last PAP: per gyn in [place], [place] form completed today  G0P0  Miscarriage: 0  Abortion: 0  Date of LMP: 07/28/2022  Hx of STDs: none  Birth Control: no  Menopause: n/a  Hysterectomy: no  Last mammogram- n/a    Family Hx:  HTN- parents  Diabetes- none  HLD- none  MI- none  Stroke - none  Cancer-  MGM (breast cancer)  Mental Health Disorder- PGM (dementia)  Autoimmune Disease- none    Current/Previous Specialists:  Gyn     Preventive:  COVID-19 vac - received 2 doses  Flu vaccine- given today  Tetanus vaccine- unsure  HPV vac- thinks she has already received this  Hep C screening- ordered  MyChart activation - sent via text today    Medical History/Health Concerns:    Depression -  Presents today for depression  Ongoing symptoms include:  see PHQ-9  Current treatment: None  Has never been diagnosed with this before  Had done talk therapy in [place] but it was expensive  In 2019-09-21 her aunt told her to get a dog to help, which has helped  Her grandmother passed away in 2014-09-21, and best friend in Sep 21, 2019  She is moving in to a new apartment with her boyfriend and wants to bring her dog  She has a pitlab mix dog      08/21/2022    11:14 AM   PHQ-9 Questionaire   Little interest or pleasure in doing  things 2   Feeling down, depressed, or hopeless 2   Trouble falling or staying asleep, or sleeping too much 3   Feeling tired or having little energy 3   Poor appetite or overeating 1   Feeling bad about yourself - or that you are a failure or have let yourself or your family down 0   Trouble concentrating on things, such as reading the newspaper or watching television 1   Moving or speaking so slowly that other people could have noticed. Or the opposite - being so fidgety or restless that you have been moving around a lot more than usual 0   Thoughts that you would be better off dead, or of hurting yourself in some way 0   PHQ-9 Total Score 12   If you checked off any problems, how difficult have these problems made it for you to do your work, take care of things at home, or get along with other people? 0     Anxiety-  Patient is seen for anxiety  Associated symptoms:  see GAD 7  Current treatment:  None      08/21/2022    11:21 AM   GAD-7 SCREENING   Feeling nervous, anxious, or on edge More than half the days   Not being able to  stop or control worrying Several days   Worrying too much about different things Nearly every day   Trouble relaxing Nearly every day   Being so restless that it is hard to sit still Nearly every day   Becoming easily annoyed or irritable Not at all   Feeling afraid as if something awful might happen Several days   GAD-7 Total Score 13   How difficult have these problems made it for you to do your work, take care of things at home, or get along with other people? Not difficult at all       Review of Systems   Respiratory:  Negative for shortness of breath.    Cardiovascular:  Negative for chest pain and leg swelling.   Neurological:  Negative for dizziness, light-headedness and headaches.   Psychiatric/Behavioral:  Positive for dysphoric mood. Negative for suicidal ideas. The patient is nervous/anxious.       Objective:   BP 132/86   Pulse 89   Temp 98.4 F (36.9 C) (Oral)   Resp 16   Ht 1.549 m (5' 1"$ )   Wt 78.5 kg (173 lb)   SpO2 99%   BMI 32.69 kg/m       Physical Exam  Vitals and nursing note reviewed.   Constitutional:       General: She is not in acute distress.     Appearance: She is not ill-appearing.   HENT:      Head: Normocephalic and atraumatic.   Cardiovascular:      Rate and Rhythm: Normal rate and regular rhythm.   Pulmonary:      Effort: Pulmonary effort is normal. No respiratory distress.      Breath sounds: No wheezing, rhonchi or rales.   Musculoskeletal:         General: Normal range of motion.   Skin:     General: Skin is warm and dry.   Neurological:      General: No focal deficit present.      Mental Status: She is alert.   Psychiatric:         Mood and Affect: Affect is tearful.  Thought Content: Thought content normal.         Judgment: Judgment normal.       Gunnar Fusi, NP-C

## 2022-09-15 DIAGNOSIS — Z0000 Encounter for general adult medical examination without abnormal findings: Secondary | ICD-10-CM

## 2022-09-15 NOTE — Assessment & Plan Note (Deleted)
Physical activity for a total of 150 minutes per week is recommended, drink plenty of water.  Reduce CHO intake, such as white pastas, white rice, white breads. Avoid fried foods, and eat more green, leafy vegetables, whole grains, and lean proteins.  Discussed recommended screenings and vaccines, advised to complete fasting labs, will call with results

## 2022-09-18 ENCOUNTER — Encounter: Payer: BLUE CROSS/BLUE SHIELD | Attending: Family | Primary: Family

## 2022-09-18 DIAGNOSIS — Z0000 Encounter for general adult medical examination without abnormal findings: Secondary | ICD-10-CM

## 2022-09-18 NOTE — Progress Notes (Unsigned)
Brandy Acevedo is a 27 y.o. female is seen on 09/18/2022 for No chief complaint on file.      Assessment & Plan:     1. Annual physical exam  Assessment & Plan:  Physical activity for a total of 150 minutes per week is recommended, drink plenty of water.  Reduce CHO intake, such as white pastas, white rice, white breads. Avoid fried foods, and eat more green, leafy vegetables, whole grains, and lean proteins.  Discussed recommended screenings and vaccines, advised to complete fasting labs, will call with results  2. Moderate major depression ([place])  3. Anxiety  4. 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  ETOH use- once a week  Recreational drug use- none  Tobacco use- none     Gyn Hx:  Last PAP: per gyn in [place], [place] form completed today  G0P0  Miscarriage: 0  Abortion: 0  Date of LMP: 07/28/2022  Hx of STDs: none  Birth Control: no  Menopause: n/a  Hysterectomy: no  Last mammogram- n/a     Family Hx:  HTN- parents  Diabetes- none  HLD- none  MI- none  Stroke - none  Cancer- MGM (breast cancer)  Mental Health Disorder- PGM (dementia)  Autoimmune Disease- none    Preventive:  Diet:  Exercise:  Last eye exam-  Last dental exam-  COVID-19 vac - received 2 doses  Tetanus vaccine- unsure  HPV vac- thinks she has already received this  Hep C screening- ordered  MyChart activation - sent via text today     Additional Health Concerns Addressed Today:    Depression -  08/21/2022:  Presents today for depression  Ongoing symptoms include:  see PHQ-9  Current treatment: None  Has never been diagnosed with this before  Had done talk therapy in [place] but it was expensive  In Oct 12, 2019 her aunt told her to get a dog to help, which has helped  Her grandmother passed away in 10/12/14, and best friend  in 10/12/2019  She is moving in to a new apartment with her boyfriend and wants to bring her dog  She has a pitlab mix dog  09/18/2022:  Presents for depression follow up  Declined medication, unable to afford talk therapy      08/21/2022    11:14 AM   PHQ-9 Questionaire   Little interest or pleasure in doing things 2   Feeling down, depressed, or hopeless 2   Trouble falling or staying asleep, or sleeping too much 3   Feeling tired or having little energy 3   Poor appetite or overeating 1   Feeling bad about yourself - or that you are a failure or have let yourself or your family down 0   Trouble concentrating on things, such as reading the newspaper or watching television 1   Moving or speaking so slowly that other people could have noticed. Or the opposite - being so fidgety or restless that you have been moving around a lot more than usual 0   Thoughts that you would be better off dead, or of hurting yourself in some way 0   PHQ-9 Total Score 12   If you checked off any problems, how difficult have these problems made it for  you to do your work, take care of things at home, or get along with other people? 0     Anxiety-  Patient is seen for anxiety  Associated symptoms:  see GAD 7  Current treatment: none  Previously declined medication, unable to afford talk therapy      08/21/2022    11:21 AM   GAD-7 SCREENING   Feeling nervous, anxious, or on edge More than half the days   Not being able to stop or control worrying Several days   Worrying too much about different things Nearly every day   Trouble relaxing Nearly every day   Being so restless that it is hard to sit still Nearly every day   Becoming easily annoyed or irritable Not at all   Feeling afraid as if something awful might happen Several days   GAD-7 Total Score 13   How difficult have these problems made it for you to do your work, take care of things at home, or get along with other people? Not difficult at all         Review of Systems   Constitutional:   Negative for appetite change, chills, fever and unexpected weight change.   Eyes:  Negative for visual disturbance.   Respiratory:  Negative for cough, chest tightness and shortness of breath.    Cardiovascular:  Negative for chest pain, palpitations and leg swelling.   Gastrointestinal:  Negative for abdominal pain, blood in stool, constipation, diarrhea, nausea and vomiting.   Endocrine: Negative for cold intolerance, heat intolerance, polydipsia, polyphagia and polyuria.   Genitourinary:  Negative for difficulty urinating, dysuria, frequency, hematuria, pelvic pain and urgency.   Musculoskeletal:  Negative for arthralgias, gait problem and joint swelling.   Skin:  Negative for rash.   Neurological:  Negative for dizziness, weakness, light-headedness, numbness and headaches.   Hematological:  Negative for adenopathy. Does not bruise/bleed easily.   Psychiatric/Behavioral:  Negative for behavioral problems.      Objective:   There were no vitals taken for this visit.   No results found.    Physical Exam  Vitals and nursing note reviewed.   Constitutional:       General: She is not in acute distress.     Appearance: She is not ill-appearing.   HENT:      Head: Normocephalic and atraumatic.      Right Ear: Tympanic membrane, ear canal and external ear normal. There is no impacted cerumen.      Left Ear: Tympanic membrane, ear canal and external ear normal. There is no impacted cerumen.      Mouth/Throat:      Mouth: Mucous membranes are moist.      Pharynx: Oropharynx is clear. No oropharyngeal exudate or posterior oropharyngeal erythema.   Eyes:      Extraocular Movements: Extraocular movements intact.      Pupils: Pupils are equal, round, and reactive to light.   Cardiovascular:      Rate and Rhythm: Normal rate and regular rhythm.   Pulmonary:      Effort: Pulmonary effort is normal. No respiratory distress.      Breath sounds: No stridor. No wheezing, rhonchi or rales.   Abdominal:      General: Bowel sounds are  normal. There is no distension.      Palpations: Abdomen is soft. There is no mass.      Tenderness: There is no abdominal tenderness. There is no right CVA tenderness, left CVA tenderness,  guarding or rebound.   Musculoskeletal:         General: Normal range of motion.      Cervical back: Normal range of motion and neck supple.   Lymphadenopathy:      Cervical: No cervical adenopathy.   Skin:     General: Skin is warm and dry.      Capillary Refill: Capillary refill takes less than 2 seconds.   Neurological:      General: No focal deficit present.      Mental Status: She is alert and oriented to person, place, and time.   Psychiatric:         Mood and Affect: Mood normal.         Thought Content: Thought content normal.         Judgment: Judgment normal.            Gunnar Fusi, FNP-C

## 2022-09-28 NOTE — Assessment & Plan Note (Signed)
Physical activity for a total of 150 minutes per week is recommended, drink plenty of water.  Reduce CHO intake, such as white pastas, white rice, white breads. Avoid fried foods, and eat more green, leafy vegetables, whole grains, and lean proteins.  Discussed recommended screenings and vaccines, advised to complete fasting labs, will call with results

## 2022-09-30 ENCOUNTER — Encounter: Payer: BLUE CROSS/BLUE SHIELD | Attending: Family | Primary: Family

## 2022-09-30 DIAGNOSIS — Z0000 Encounter for general adult medical examination without abnormal findings: Secondary | ICD-10-CM

## 2022-09-30 NOTE — Progress Notes (Unsigned)
Brandy Acevedo presents today for   Chief Complaint   Patient presents with    Annual Exam    Depression    Anxiety       Is someone accompanying this pt? ***    Is the patient using any DME equipment during OV? ***    Depression Screening:      08/21/2022    11:14 AM   PHQ-9 Questionaire   Little interest or pleasure in doing things 2   Feeling down, depressed, or hopeless 2   Trouble falling or staying asleep, or sleeping too much 3   Feeling tired or having little energy 3   Poor appetite or overeating 1   Feeling bad about yourself - or that you are a failure or have let yourself or your family down 0   Trouble concentrating on things, such as reading the newspaper or watching television 1   Moving or speaking so slowly that other people could have noticed. Or the opposite - being so fidgety or restless that you have been moving around a lot more than usual 0   Thoughts that you would be better off dead, or of hurting yourself in some way 0   PHQ-9 Total Score 12   If you checked off any problems, how difficult have these problems made it for you to do your work, take care of things at home, or get along with other people? 0        GAD 7-Anxiety       08/21/2022    11:21 AM   GAD-7 SCREENING   Feeling nervous, anxious, or on edge More than half the days   Not being able to stop or control worrying Several days   Worrying too much about different things Nearly every day   Trouble relaxing Nearly every day   Being so restless that it is hard to sit still Nearly every day   Becoming easily annoyed or irritable Not at all   Feeling afraid as if something awful might happen Several days   GAD-7 Total Score 13   How difficult have these problems made it for you to do your work, take care of things at home, or get along with other people? Not difficult at all          Learning Assessment:  No question data found.     Fall Risk       No data to display                   Travel Screening:    Travel Screening     No  screening recorded since 09/29/22 0000       Travel History   Travel since 09/01/22    No documented travel since 09/01/22          Health Maintenance reviewed and discussed and ordered per Provider.  Transportation Needs: Unknown (08/21/2022)    PRAPARE - Therapist, art (Medical): Not on file     Lack of Transportation (Non-Medical): No      Food Insecurity: No Food Insecurity (08/21/2022)    Hunger Vital Sign     Worried About Running Out of Food in the Last Year: Never true     Ran Out of Food in the Last Year: Never true     Financial Resource Strain: Low Risk  (08/21/2022)    Overall Financial Resource Strain (CARDIA)     Difficulty of  Paying Living Expenses: Not very hard     Housing Stability: Unknown (08/21/2022)    Housing Stability Vital Sign     Unable to Pay for Housing in the Last Year: Not on file     Number of Places Lived in the Last Year: Not on file     Unstable Housing in the Last Year: No       Did you provide resources if patient requested them? ***      Health Maintenance Due   Topic Date Due    Hepatitis B vaccine (1 of 3 - 3-dose series) Never done    Varicella vaccine (1 of 2 - 2-dose childhood series) Never done    Hepatitis C screen  Never done    Pap smear  Never done    COVID-19 Vaccine (3 - 2023-24 season) 03/07/2022   .      "Have you been to the ER, urgent care clinic since your last visit?  Hospitalized since your last visit?"    {YES/NO:201460181}    "Have you seen or consulted any other health care providers outside of [facility] since your last visit?"    {YES/NO:201460181}

## 2022-09-30 NOTE — Progress Notes (Deleted)
Brandy Acevedo is a 27 y.o. female is seen on 09/30/2022 for No chief complaint on file.      Assessment & Plan:     1. Annual physical exam  Assessment & Plan:  Physical activity for a total of 150 minutes per week is recommended, drink plenty of water.  Reduce CHO intake, such as white pastas, white rice, white breads. Avoid fried foods, and eat more green, leafy vegetables, whole grains, and lean proteins.  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 house currently  ETOH use- once a week  Recreational drug use- none  Tobacco use- none     Gyn Hx:  Last PAP: per gyn in [place], [place] form completed today  [id number]  Miscarriage: 0  Abortion: 0  Date of LMP: 07/28/2022  Hx of STDs: none  Birth Control: no  Menopause: n/a  Hysterectomy: no  Last mammogram- n/a     Family Hx:  HTN- parents  Diabetes- none  HLD- none  MI- none  Stroke - none  Cancer- MGM (breast cancer)  Mental Health Disorder- PGM (dementia)  Autoimmune Disease- none    Preventive:  Diet:  Exercise:  Last eye exam-  Last dental exam-  COVID-19 vac - received 2 doses  Tetanus vaccine- unsure  HPV vac- thinks she has already received this  Hep C screening- ordered  MyChart activation - sent via text today     Additional Health Concerns Addressed Today:    Depression -  08/21/2022:  Presents today for depression  Ongoing symptoms include:  see PHQ-9  Current treatment: None  Has never been diagnosed with this before  Had done talk therapy in [place] but it was expensive  In 10/26/19 her aunt told her to get a dog to help, which has helped  Her grandmother passed away in 10-26-2014, and best friend in Oct 26, 2019  She is moving in to a new apartment with her boyfriend and wants to bring  her dog  She has a pitlab mix dog  09/18/2022:  Presents for depression follow up  Declined medication, unable to afford talk therapy      08/21/2022    11:14 AM   PHQ-9 Questionaire   Little interest or pleasure in doing things 2   Feeling down, depressed, or hopeless 2   Trouble falling or staying asleep, or sleeping too much 3   Feeling tired or having little energy 3   Poor appetite or overeating 1   Feeling bad about yourself - or that you are a failure or have let yourself or your family down 0   Trouble concentrating on things, such as reading the newspaper or watching television 1   Moving or speaking so slowly that other people could have noticed. Or the opposite - being so fidgety or restless that you have been moving around a lot more than usual 0   Thoughts that you would be better off dead, or of hurting yourself in some way 0   PHQ-9 Total Score 12   If you checked off any problems, how difficult have these problems made it for you to do your work, take care of things  at home, or get along with other people? 0     Anxiety-  Patient is seen for anxiety  Associated symptoms:  see GAD 7  Current treatment: none  Previously declined medication, unable to afford talk therapy      08/21/2022    11:21 AM   GAD-7 SCREENING   Feeling nervous, anxious, or on edge More than half the days   Not being able to stop or control worrying Several days   Worrying too much about different things Nearly every day   Trouble relaxing Nearly every day   Being so restless that it is hard to sit still Nearly every day   Becoming easily annoyed or irritable Not at all   Feeling afraid as if something awful might happen Several days   GAD-7 Total Score 13   How difficult have these problems made it for you to do your work, take care of things at home, or get along with other people? Not difficult at all         Review of Systems   Constitutional:  Negative for appetite change, chills, fever and unexpected weight change.   Eyes:   Negative for visual disturbance.   Respiratory:  Negative for cough, chest tightness and shortness of breath.    Cardiovascular:  Negative for chest pain, palpitations and leg swelling.   Gastrointestinal:  Negative for abdominal pain, blood in stool, constipation, diarrhea, nausea and vomiting.   Endocrine: Negative for cold intolerance, heat intolerance, polydipsia, polyphagia and polyuria.   Genitourinary:  Negative for difficulty urinating, dysuria, frequency, hematuria, pelvic pain and urgency.   Musculoskeletal:  Negative for arthralgias, gait problem and joint swelling.   Skin:  Negative for rash.   Neurological:  Negative for dizziness, weakness, light-headedness, numbness and headaches.   Hematological:  Negative for adenopathy. Does not bruise/bleed easily.   Psychiatric/Behavioral:  Negative for behavioral problems.      Objective:   There were no vitals taken for this visit.   No results found.    Physical Exam  Vitals and nursing note reviewed.   Constitutional:       General: She is not in acute distress.     Appearance: She is not ill-appearing.   HENT:      Head: Normocephalic and atraumatic.      Right Ear: Tympanic membrane, ear canal and external ear normal. There is no impacted cerumen.      Left Ear: Tympanic membrane, ear canal and external ear normal. There is no impacted cerumen.      Mouth/Throat:      Mouth: Mucous membranes are moist.      Pharynx: Oropharynx is clear. No oropharyngeal exudate or posterior oropharyngeal erythema.   Eyes:      Extraocular Movements: Extraocular movements intact.      Pupils: Pupils are equal, round, and reactive to light.   Cardiovascular:      Rate and Rhythm: Normal rate and regular rhythm.   Pulmonary:      Effort: Pulmonary effort is normal. No respiratory distress.      Breath sounds: No stridor. No wheezing, rhonchi or rales.   Abdominal:      General: Bowel sounds are normal. There is no distension.      Palpations: Abdomen is soft. There is no mass.       Tenderness: There is no abdominal tenderness. There is no right CVA tenderness, left CVA tenderness, guarding or rebound.   Musculoskeletal:  General: Normal range of motion.      Cervical back: Normal range of motion and neck supple.   Lymphadenopathy:      Cervical: No cervical adenopathy.   Skin:     General: Skin is warm and dry.      Capillary Refill: Capillary refill takes less than 2 seconds.   Neurological:      General: No focal deficit present.      Mental Status: She is alert and oriented to person, place, and time.   Psychiatric:         Mood and Affect: Mood normal.         Thought Content: Thought content normal.         Judgment: Judgment normal.            Gunnar Fusi, FNP-C

## 2022-10-01 NOTE — Progress Notes (Signed)
This encounter was created in error - please disregard.

## 2023-02-17 ENCOUNTER — Encounter

## 2023-02-19 ENCOUNTER — Encounter

## 2024-07-28 ENCOUNTER — Ambulatory Visit
Admit: 2024-07-28 | Discharge: 2024-07-28 | Payer: BLUE CROSS/BLUE SHIELD | Attending: Family Medicine | Primary: Family Medicine

## 2024-07-28 ENCOUNTER — Inpatient Hospital Stay: Admit: 2024-07-28 | Payer: BLUE CROSS/BLUE SHIELD | Primary: Family Medicine

## 2024-07-28 NOTE — Progress Notes (Signed)
 Brandy Acevedo presents today for   Chief Complaint   Patient presents with    Establish Care     Routine lab work.     Breast Pain     Patient is complaining about back pain due to breast size. Patient states that she takes Tylenol for pain. Patient requesting referral for breast surgery.     Other     Weight loss.        Is someone accompanying this pt? [place] mother    Is the patient using any DME equipment during OV? no    Depression Screening:      07/28/2024    11:05 AM 08/21/2022    11:14 AM   PHQ-9 Questionaire   Little interest or pleasure in doing things 0 2   Feeling down, depressed, or hopeless 0 2   Trouble falling or staying asleep, or sleeping too much 0 3   Feeling tired or having little energy 0 3   Poor appetite or overeating 0 1   Feeling bad about yourself - or that you are a failure or have let yourself or your family down 0 0   Trouble concentrating on things, such as reading the newspaper or watching television 0 1   Moving or speaking so slowly that other people could have noticed. Or the opposite - being so fidgety or restless that you have been moving around a lot more than usual 0 0   Thoughts that you would be better off dead, or of hurting yourself in some way 0 0   PHQ-9 Total Score 0 12    If you checked off any problems, how difficult have these problems made it for you to do your work, take care of things at home, or get along with other people? 0 0       Data saved with a previous flowsheet row definition        GAD 7-Anxiety       07/28/2024    11:15 AM 08/21/2022    11:21 AM   GAD-7 SCREENING   Feeling nervous, anxious, or on edge Not at all More than half the days   Not being able to stop or control worrying Not at all Several days   Worrying too much about different things Not at all Nearly every day   Trouble relaxing Not at all Nearly every day   Being so restless that it is hard to sit still Not at all Nearly every day   Becoming easily annoyed or irritable Not at all Not  at all   Feeling afraid as if something awful might happen Not at all Several days   GAD-7 Total Score 0 13   How difficult have these problems made it for you to do your work, take care of things at home, or get along with other people? Not difficult at all Not difficult at all          Learning Assessment:  No question data found.     Fall Risk       No data to display                   Travel Screening:    Travel Screening       Question Response    Have you been in contact with someone who was sick? No / Unsure    Do you have any of the following new or worsening symptoms? None of these  Have you traveled internationally or domestically in the last month? No          Travel History   Travel since 06/27/24    No documented travel since 06/27/24            Health Maintenance reviewed and discussed and ordered per Provider.  Transportation Needs: No Transportation Needs (07/28/2024)    PRAPARE - Therapist, Art (Medical): No     Lack of Transportation (Non-Medical): No      Food Insecurity: No Food Insecurity (07/28/2024)    Hunger Vital Sign     Worried About Running Out of Food in the Last Year: Never true     Ran Out of Food in the Last Year: Never true     Financial Resource Strain: Low Risk  (08/21/2022)    Overall Financial Resource Strain (CARDIA)     Difficulty of Paying Living Expenses: Not very hard     Housing Stability: Low Risk  (07/28/2024)    Housing Stability Vital Sign     Unable to Pay for Housing in the Last Year: No     Number of Times Moved in the Last Year: 0     Homeless in the Last Year: No       Did you provide resources if patient requested them? Yes patient declined       Health Maintenance Due   Topic Date Due    Varicella vaccine (1 of 2 - 13+ 2-dose series) Never done    HIV screen  Never done    Hepatitis C screen  Never done    Hepatitis B vaccine (1 of 3 - 19+ 3-dose series) Never done    Pap smear  Never done    Depression Monitoring  08/22/2023    Flu  vaccine (1) 02/05/2024    COVID-19 Vaccine (3 - 2025-26 season) 03/07/2024   .        Have you been to the ER, urgent care clinic since your last visit?  Hospitalized since your last visit?    NO    Have you seen or consulted any other health care providers outside of [facility] since your last visit?    NO     Have you had a pap smear?    Yes, [facility] year [facility]     No cervical cancer screening on file             Click Here for Release of Records Request

## 2024-07-28 NOTE — Progress Notes (Signed)
 Brandy Acevedo (DOB:  1996-03-06) is a 29 y.o. female,Established patient, here for evaluation of the following chief complaint(s):  Establish Care (Routine lab work. ), Breast Pain (Patient is complaining about back pain due to breast size. Patient states that she takes Tylenol for pain. Patient requesting referral for breast surgery. ), and Other (Weight loss. )         Assessment & Plan  Family hx of hypertension       Orders:    Lipid Panel; Future    Comprehensive Metabolic Panel; Future    Encounter for lipid screening for cardiovascular disease       Orders:    Lipid Panel; Future    Comprehensive Metabolic Panel; Future    BMI 35.0-35.9,adult       Orders:    [facility] - Harden Evan, MD, Bariatric Surgery, [place]    Lipid Panel; Future    Comprehensive Metabolic Panel; Future    TSH; Future    Routine screening for STI (sexually transmitted infection)       Orders:    Chlamydia, Gonorrhea, Trichomoniasis; Future    RPR; Future    HIV 1/2 Ag/Ab, 4TH Generation,W Rflx Confirm; Future    Hepatitis Panel, Acute; Future    Chronic back pain, unspecified back location, unspecified back pain laterality   work on wt loss.  Consider plastic surgery eval.          Large breasts   Consider plastic surgery eval.             Return in about 4 weeks (around 08/25/2024) for lab review, specialist eval.       Subjective   HPI  Pt presents to establish care.  Her prior pcp is leaving this ofc.  Pt was last seen 08/21/22.  She had dx of depression and anxiety.      Pt reports that she has chronic back pain.  She is interested in possible breast reduction surgery.  Mom also notes past hx of mild scoliosis.  Pt sees a chiropractor from time to time.      Pt is interested in medication for wt loss.  She has taken wegovy previously.   She took it for 2.5 months.          Review of Systems   Constitutional: Negative.    HENT: Negative.     Respiratory: Negative.     Cardiovascular: Negative.     Musculoskeletal:  Positive for back pain.   All other systems reviewed and are negative.         Objective   Physical Exam  Vitals and nursing note reviewed.   Constitutional:       General: She is not in acute distress.     Appearance: Normal appearance.   HENT:      Head: Normocephalic and atraumatic.      Right Ear: External ear normal.      Left Ear: External ear normal.      Nose: Nose normal.      Mouth/Throat:      Mouth: Mucous membranes are moist.   Eyes:      Extraocular Movements: Extraocular movements intact.      Conjunctiva/sclera: Conjunctivae normal.   Cardiovascular:      Rate and Rhythm: Normal rate and regular rhythm.      Heart sounds: No murmur heard.     No friction rub. No gallop.   Pulmonary:      Effort: Pulmonary  effort is normal.      Breath sounds: Normal breath sounds. No wheezing, rhonchi or rales.   Musculoskeletal:         General: Normal range of motion.      Cervical back: Normal range of motion.   Skin:     General: Skin is warm and dry.   Neurological:      Mental Status: She is alert and oriented to person, place, and time.      Coordination: Coordination normal.   Psychiatric:         Mood and Affect: Mood normal.         Behavior: Behavior normal.         Thought Content: Thought content normal.         Judgment: Judgment normal.            On this date 07/28/2024 I have spent 54 minutes reviewing previous notes, test results and face to face with the patient discussing the diagnosis and importance of compliance with the treatment plan as well as documenting on the day of the visit.      An electronic signature was used to authenticate this note.    --Levon LITTIE Gavel, MD

## 2024-07-29 ENCOUNTER — Inpatient Hospital Stay: Admit: 2024-07-29 | Payer: BLUE CROSS/BLUE SHIELD | Primary: Family Medicine

## 2024-07-29 DIAGNOSIS — Z8249 Family history of ischemic heart disease and other diseases of the circulatory system: Principal | ICD-10-CM

## 2024-07-29 LAB — COMPREHENSIVE METABOLIC PANEL
ALT: 25 U/L (ref 10–35)
AST: 25 U/L (ref 10–38)
Albumin/Globulin Ratio: 1.2 (ref 0.8–1.7)
Albumin: 3.7 g/dL (ref 3.4–5.0)
Alk Phosphatase: 93 U/L (ref 45–117)
Anion Gap: 9 mmol/L (ref 3.0–18.0)
BUN/Creatinine Ratio: 11 — ABNORMAL LOW (ref 12–20)
BUN: 9 mg/dL (ref 6–23)
CO2: 26 mmol/L (ref 21–32)
Calcium: 9.5 mg/dL (ref 8.6–10.2)
Chloride: 105 mmol/L (ref 98–107)
Creatinine: 0.85 mg/dL (ref 0.6–1.3)
Est, Glom Filt Rate: >90 mL/min/{1.73_m2} (ref >60)
Globulin: 3 g/dL (ref 2.0–4.0)
Glucose: 91 mg/dL (ref 74–108)
Potassium: 4.6 mmol/L (ref 3.5–5.5)
Sodium: 141 mmol/L (ref 136–145)
Total Bilirubin: 0.3 mg/dL (ref 0.2–1.0)
Total Protein: 6.7 g/dL (ref 6.4–8.2)

## 2024-07-29 LAB — HEPATITIS PANEL, ACUTE
Hep A IgM: NONREACTIVE
Hep B Core Ab, IgM: NONREACTIVE
Hepatitis B Surface Ag: NONREACTIVE
Hepatitis C Ab: NONREACTIVE

## 2024-07-29 LAB — CHLAMYDIA, GONORRHEA, TRICHOMONIASIS
Chlamydia trachomatis, NAA: NEGATIVE
Neisseria Gonorrhoeae, NAA: NEGATIVE
Trichomonas Vaginalis by NAA: NEGATIVE

## 2024-07-29 LAB — HIV 1/2 AG/AB, 4TH GENERATION,W RFLX CONFIRM: HIV 1/2 Interp: NONREACTIVE

## 2024-07-29 LAB — LIPID PANEL
Chol/HDL Ratio: 4.3 (ref 0–5.0)
Cholesterol, Total: 180 mg/dL
HDL: 42 mg/dL (ref 40–60)
LDL Cholesterol: 118 mg/dL — ABNORMAL HIGH (ref 0–100)
Triglycerides: 99 mg/dL (ref 0–150)
VLDL Cholesterol Calculated: 20 mg/dL

## 2024-07-29 LAB — TSH: TSH, 3rd Generation: 1.04 u[IU]/mL (ref 0.27–4.20)

## 2024-07-30 LAB — RPR: RPR: NONREACTIVE

## 2024-07-31 NOTE — Assessment & Plan Note (Signed)
{  A/P Summary:[id number]}    Orders:    [facility] - Harden Evan, MD, Bariatric Surgery, [place]    Lipid Panel; Future    Comprehensive Metabolic Panel; Future    TSH; Future

## 2024-07-31 NOTE — Assessment & Plan Note (Signed)
" {  A/P [phone]}    Orders:    Lipid Panel; Future    Comprehensive Metabolic Panel; Future    "

## 2024-07-31 NOTE — Assessment & Plan Note (Signed)
{  A/P [phone]}work on wt loss.  Consider plastic surgery eval.

## 2024-07-31 NOTE — Assessment & Plan Note (Signed)
{  A/P Summary:[phone]}Consider plastic surgery eval.

## 2024-08-10 NOTE — Telephone Encounter (Signed)
 Patient states she was just notified by the plastic surgeon that she was referred to by this office that they are not in network with her insurance. Patient is requesting a new engineer, petroleum that is in network with her insurance

## 2024-08-10 NOTE — Telephone Encounter (Signed)
 Called and spoke with patient. Patient referral was sent to [facility]. Patient was given referral information verbally over the phone.

## 2024-08-25 ENCOUNTER — Encounter: Payer: BLUE CROSS/BLUE SHIELD | Attending: Family Medicine | Primary: Family Medicine

## 2024-09-02 ENCOUNTER — Encounter: Payer: BLUE CROSS/BLUE SHIELD | Attending: Surgery | Primary: Family Medicine
